# Patient Record
Sex: Male | Born: 1955 | Race: Black or African American | Hispanic: No | Marital: Married | Smoking: Never smoker
Health system: Southern US, Community
[De-identification: ages and names within clinical notes are randomized; demographics above are authoritative.]

## PROBLEM LIST (undated history)

## (undated) DIAGNOSIS — I10 Essential (primary) hypertension: Secondary | ICD-10-CM

---

## 2014-11-06 ENCOUNTER — Ambulatory Visit
Admission: RE | Admit: 2014-11-06 | Discharge: 2014-11-06 | Disposition: A | Discharge status: Home / Self Care | Payer: Federal, State, Local not specified - PPO | Source: Ambulatory Visit | Attending: Family Medicine | Admitting: Family Medicine

## 2014-11-06 ENCOUNTER — Other Ambulatory Visit: Payer: Self-pay | Admitting: Family Medicine

## 2014-11-06 DIAGNOSIS — M7989 Other specified soft tissue disorders: Secondary | ICD-10-CM

## 2014-11-06 DIAGNOSIS — M7122 Synovial cyst of popliteal space [Baker], left knee: Secondary | ICD-10-CM | POA: Insufficient documentation

## 2015-03-20 DIAGNOSIS — M79672 Pain in left foot: Secondary | ICD-10-CM | POA: Insufficient documentation

## 2015-03-20 DIAGNOSIS — I10 Essential (primary) hypertension: Secondary | ICD-10-CM | POA: Insufficient documentation

## 2015-10-19 IMAGING — US US EXTREM LOW VENOUS*L*
1 series · 13 of 24 positions shown · non-contrast
Comparison: None.

CLINICAL DATA: Left foot and ankle pain and edema for several days.
Evaluate for DVT.



[Series 1: us extrem low venous*left* · 0.10mm/px · 13 of 64 slices shown]
[im 1/64]
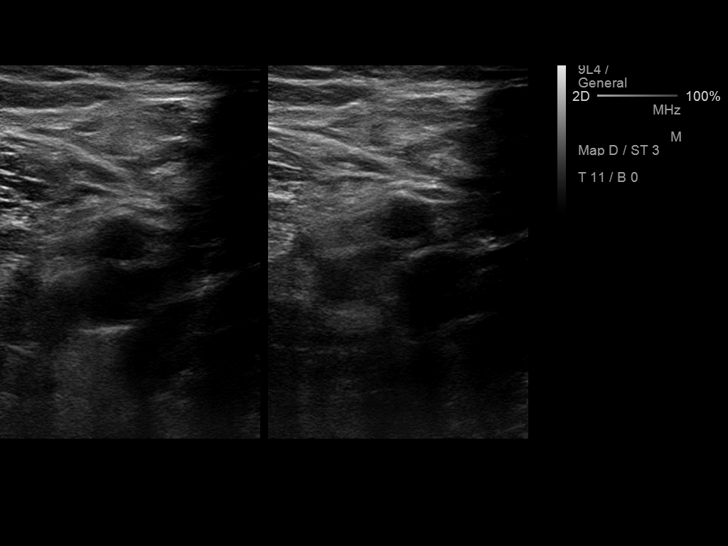
[im 6/64]
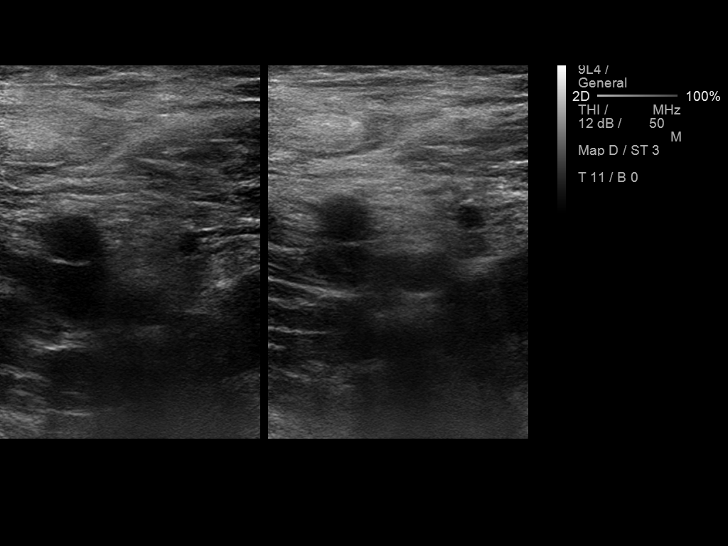
[im 11/64]
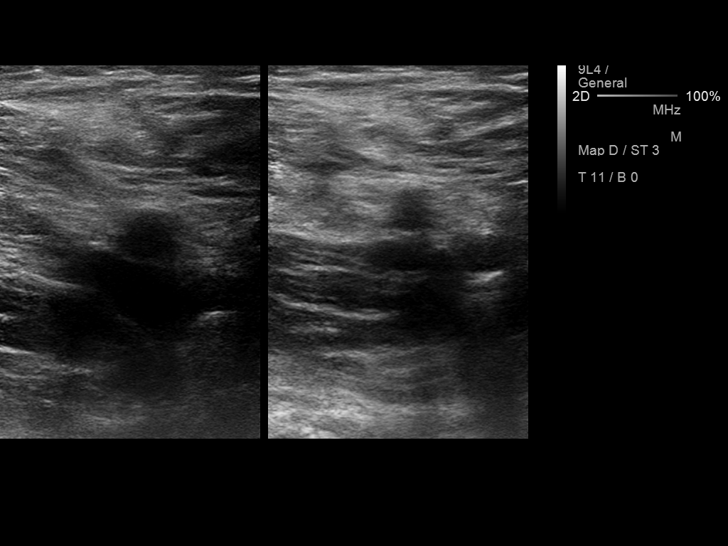
[im 17/64]
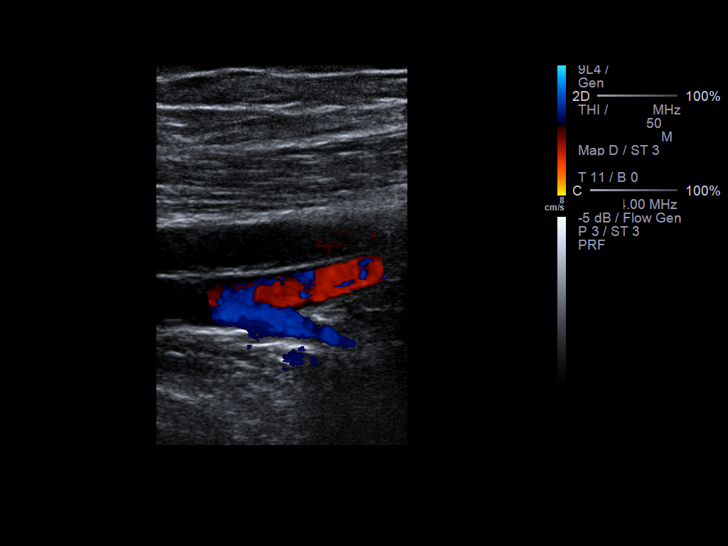
[im 22/64]
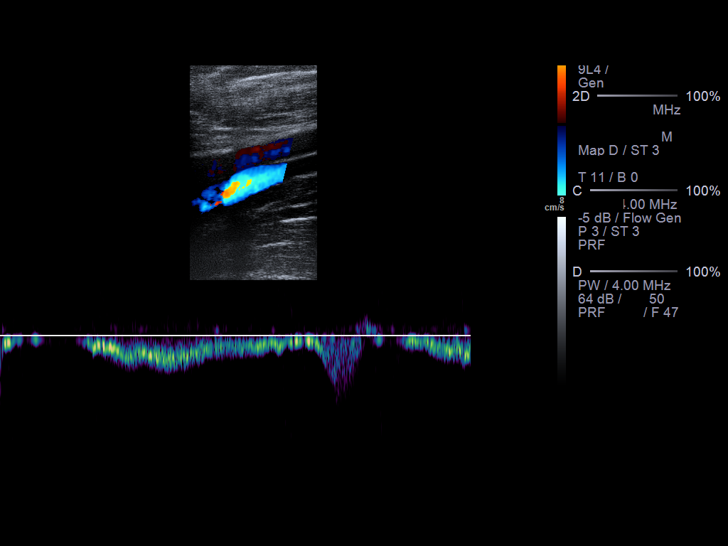
[im 28/64]
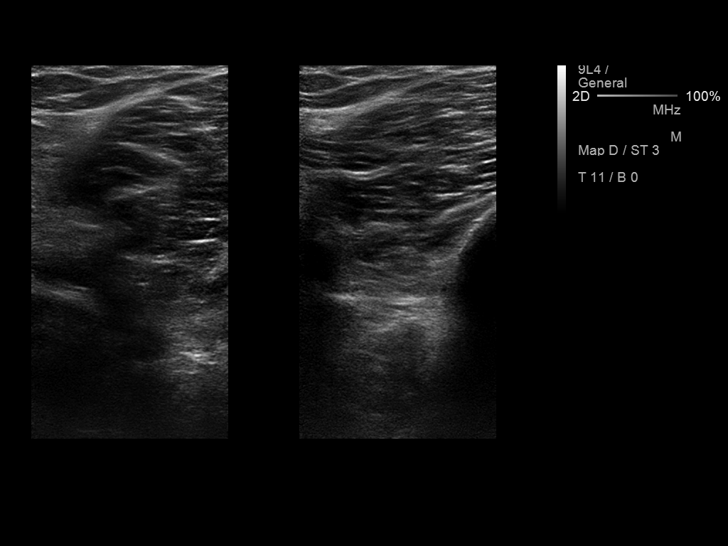
[im 33/64]
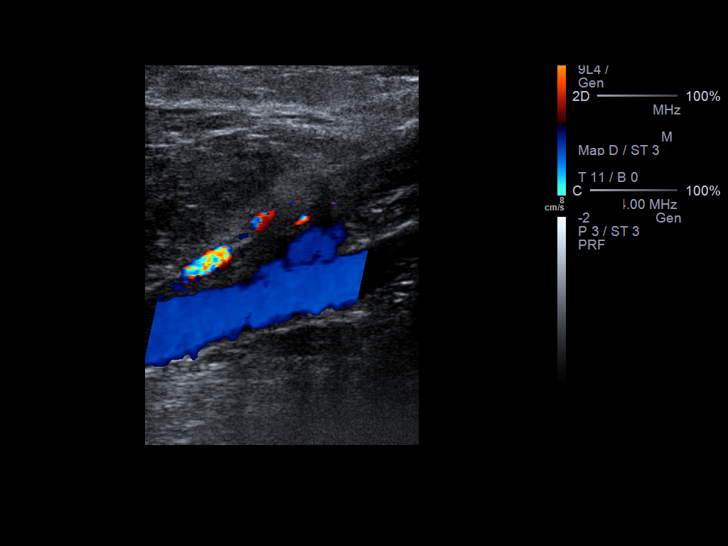
[im 36/64]
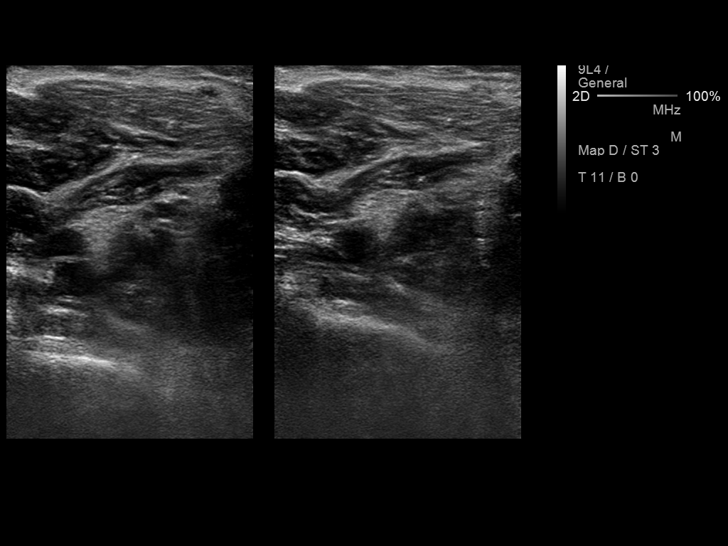
[im 42/64]
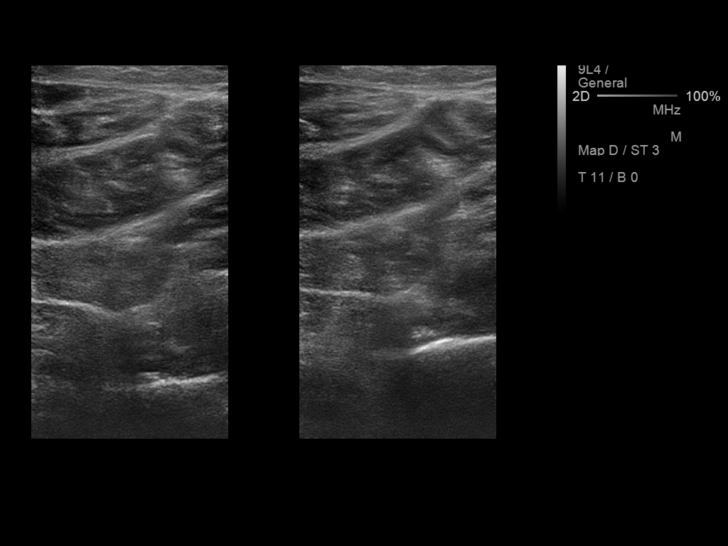
[im 47/64]
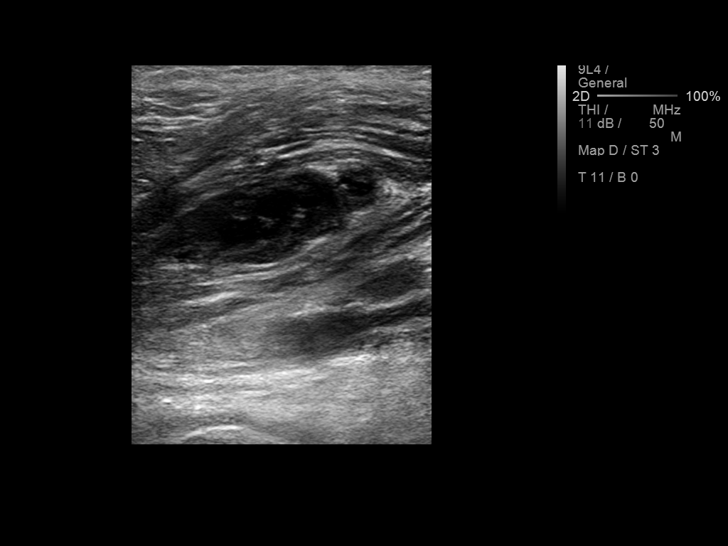
[im 53/64]
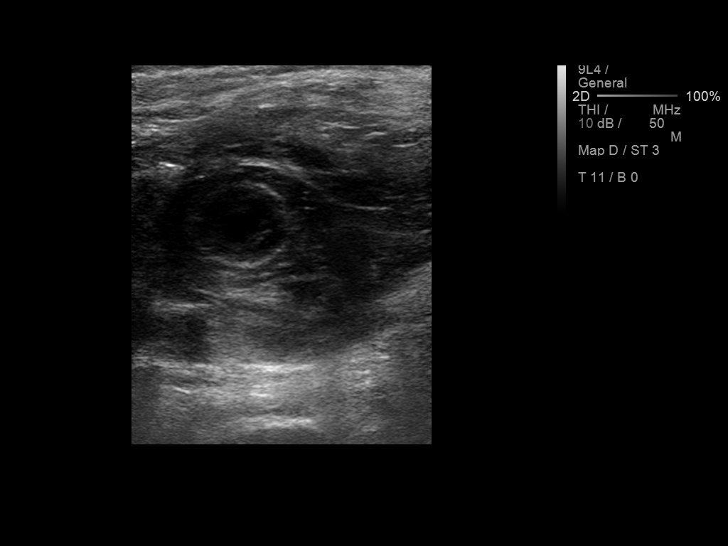
[im 58/64]
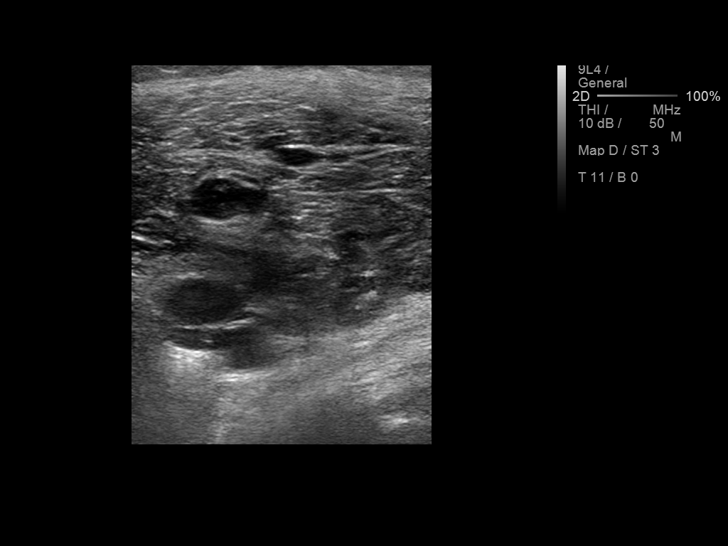
[im 64/64]
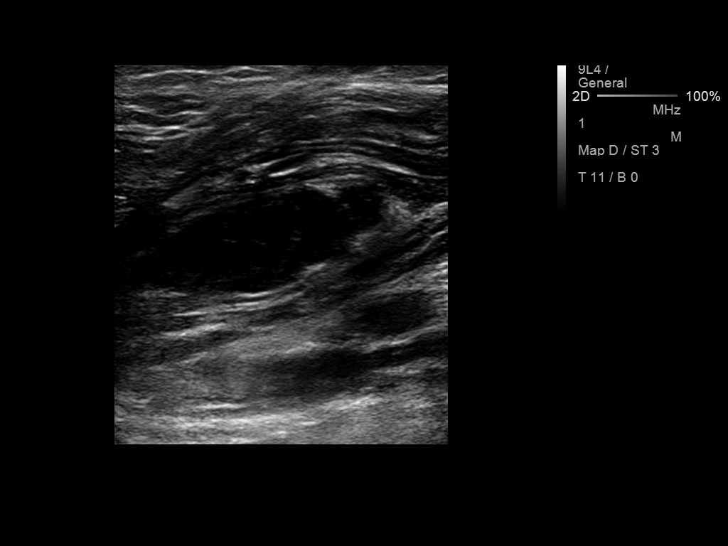

[13 of 24 positions shown; findings below may reference images not displayed]

FINDINGS: Contralateral Common Femoral Vein: Respiratory phasicity is normal
and symmetric with the symptomatic side. No evidence of thrombus.
Normal compressibility.

Common Femoral Vein: No evidence of thrombus. Normal
compressibility, respiratory phasicity and response to augmentation.

Saphenofemoral Junction: No evidence of thrombus. Normal
compressibility and flow on color Doppler imaging.

Profunda Femoral Vein: No evidence of thrombus. Normal
compressibility and flow on color Doppler imaging.

Femoral Vein: No evidence of thrombus. Normal compressibility,
respiratory phasicity and response to augmentation.

Popliteal Vein: No evidence of thrombus. Normal compressibility,
respiratory phasicity and response to augmentation.

Calf Veins: No evidence of thrombus. Normal compressibility and flow
on color Doppler imaging.

Superficial Great Saphenous Vein: No evidence of thrombus. Normal
compressibility and flow on color Doppler imaging.

Venous Reflux:  None.

Other Findings: Note is made of an approximately 3.0 x 1.1 x 1.3 cm
mixed echogenic serpiginous fluid collection within the left
popliteal fossa favored to represent a complex Baker cyst.
IMPRESSION: 1. No evidence of DVT within the left lower extremity.
2. Incidental note made of an approximately 3.0 cm complex
left-sided Baker cyst.

## 2015-11-19 ENCOUNTER — Encounter: Payer: Self-pay | Admitting: *Deleted

## 2015-11-19 ENCOUNTER — Ambulatory Visit
Admission: RE | Admit: 2015-11-19 | Discharge: 2015-11-19 | Disposition: A | Discharge status: Home / Self Care | Payer: Federal, State, Local not specified - PPO | Source: Ambulatory Visit | Attending: Gastroenterology | Admitting: Gastroenterology

## 2015-11-19 ENCOUNTER — Encounter
Admission: RE | Disposition: A | Discharge status: Home / Self Care | Payer: Self-pay | Source: Ambulatory Visit | Attending: Gastroenterology

## 2015-11-19 ENCOUNTER — Ambulatory Visit: Payer: Federal, State, Local not specified - PPO | Admitting: Anesthesiology

## 2015-11-19 DIAGNOSIS — D123 Benign neoplasm of transverse colon: Secondary | ICD-10-CM | POA: Diagnosis not present

## 2015-11-19 DIAGNOSIS — Z79899 Other long term (current) drug therapy: Secondary | ICD-10-CM | POA: Diagnosis not present

## 2015-11-19 DIAGNOSIS — D125 Benign neoplasm of sigmoid colon: Secondary | ICD-10-CM | POA: Insufficient documentation

## 2015-11-19 DIAGNOSIS — Z1211 Encounter for screening for malignant neoplasm of colon: Secondary | ICD-10-CM | POA: Diagnosis present

## 2015-11-19 DIAGNOSIS — Z7982 Long term (current) use of aspirin: Secondary | ICD-10-CM | POA: Insufficient documentation

## 2015-11-19 DIAGNOSIS — D122 Benign neoplasm of ascending colon: Secondary | ICD-10-CM | POA: Insufficient documentation

## 2015-11-19 DIAGNOSIS — D120 Benign neoplasm of cecum: Secondary | ICD-10-CM | POA: Diagnosis not present

## 2015-11-19 DIAGNOSIS — I10 Essential (primary) hypertension: Secondary | ICD-10-CM | POA: Insufficient documentation

## 2015-11-19 HISTORY — PX: COLONOSCOPY WITH PROPOFOL: SHX5780

## 2015-11-19 HISTORY — DX: Essential (primary) hypertension: I10

## 2015-11-19 SURGERY — COLONOSCOPY WITH PROPOFOL
Anesthesia: General

## 2015-11-19 MED ORDER — EPHEDRINE SULFATE 50 MG/ML IJ SOLN
INTRAMUSCULAR | Status: DC | PRN
  Administered 2015-11-19: 10 mg via INTRAVENOUS

## 2015-11-19 MED ORDER — SODIUM CHLORIDE 0.9 % IV SOLN
INTRAVENOUS | Status: DC
  Administered 2015-11-19 (×2): via INTRAVENOUS

## 2015-11-19 MED ORDER — MIDAZOLAM HCL 5 MG/5ML IJ SOLN
INTRAMUSCULAR | Status: DC | PRN
  Administered 2015-11-19: 2 mg via INTRAVENOUS

## 2015-11-19 MED ORDER — PHENYLEPHRINE 40 MCG/ML (10ML) SYRINGE FOR IV PUSH (FOR BLOOD PRESSURE SUPPORT)
PREFILLED_SYRINGE | INTRAVENOUS | Status: DC | PRN
  Administered 2015-11-19 (×3): 100 ug via INTRAVENOUS
  Administered 2015-11-19: 200 ug via INTRAVENOUS

## 2015-11-19 MED ORDER — PROPOFOL 10 MG/ML IV BOLUS
INTRAVENOUS | Status: DC | PRN
  Administered 2015-11-19: 50 mg via INTRAVENOUS

## 2015-11-19 MED ORDER — SODIUM CHLORIDE 0.9 % IV SOLN: INTRAVENOUS | Status: DC

## 2015-11-19 MED ORDER — PROPOFOL 500 MG/50ML IV EMUL
INTRAVENOUS | Status: DC | PRN
  Administered 2015-11-19: 150 ug/kg/min via INTRAVENOUS

## 2015-11-19 MED ORDER — FENTANYL CITRATE (PF) 100 MCG/2ML IJ SOLN
INTRAMUSCULAR | Status: DC | PRN
  Administered 2015-11-19: 50 ug via INTRAVENOUS

## 2015-11-19 NOTE — Anesthesia Preprocedure Evaluation (Signed)
Anesthesia Evaluation  Patient identified by MRN, date of birth, ID band Patient awake    Reviewed: Allergy & Precautions, H&P , NPO status , Patient's Chart, lab work & pertinent test results, reviewed documented beta blocker date and time   Airway Mallampati: II   Neck ROM: full    Dental  (+) Teeth Intact   Pulmonary neg pulmonary ROS,    Pulmonary exam normal        Cardiovascular hypertension, negative cardio ROS Normal cardiovascular exam Rhythm:regular Rate:Normal     Neuro/Psych negative neurological ROS  negative psych ROS   GI/Hepatic negative GI ROS, Neg liver ROS,   Endo/Other  negative endocrine ROS  Renal/GU negative Renal ROS  negative genitourinary   Musculoskeletal   Abdominal   Peds  Hematology negative hematology ROS (+)   Anesthesia Other Findings Past Medical History:   Hypertension                                               History reviewed. No pertinent surgical history. BMI    Body Mass Index   31.17 kg/m 2     Reproductive/Obstetrics                             Anesthesia Physical Anesthesia Plan  ASA: II  Anesthesia Plan: General   Post-op Pain Management:    Induction:   Airway Management Planned:   Additional Equipment:   Intra-op Plan:   Post-operative Plan:   Informed Consent: I have reviewed the patients History and Physical, chart, labs and discussed the procedure including the risks, benefits and alternatives for the proposed anesthesia with the patient or authorized representative who has indicated his/her understanding and acceptance.   Dental Advisory Given  Plan Discussed with: CRNA  Anesthesia Plan Comments:         Anesthesia Quick Evaluation

## 2015-11-19 NOTE — Transfer of Care (Signed)
Immediate Anesthesia Transfer of Care Note  Patient: Mike Robinson  Procedure(s) Performed: Procedure(s): COLONOSCOPY WITH PROPOFOL (N/A)  Patient Location: PACU  Anesthesia Type:General  Level of Consciousness: sedated  Airway & Oxygen Therapy: Patient Spontanous Breathing and Patient connected to nasal cannula oxygen  Post-op Assessment: Report given to RN and Post -op Vital signs reviewed and stable  Post vital signs: Reviewed and stable  Last Vitals:  Filed Vitals:   11/19/15 1032 11/19/15 1237  BP: 176/90 109/71  Pulse: 63 79  Temp: 36.2 C 36 C  Resp: 14 14    Last Pain: There were no vitals filed for this visit.       Complications: No apparent anesthesia complications

## 2015-11-19 NOTE — H&P (Signed)
Outpatient short stay form Pre-procedure 11/19/2015 11:21 AM Lollie Sails MD  Primary Physician: Dr Tracie Harrier  Reason for visit:  Screening colonoscopy  History of present illness:  Patient is a 60 year old male presenting today for screening colonoscopy. This is his first colonoscopy. He tolerated his prep well. There was some nausea with that though. He does not take any blood thinning agents or aspirin products with the exception of 81 mg aspirin that he is held for about a week.    Current facility-administered medications:  .  0.9 %  sodium chloride infusion, , Intravenous, Continuous, Lollie Sails, MD, Last Rate: 20 mL/hr at 11/19/15 1051 .  0.9 %  sodium chloride infusion, , Intravenous, Continuous, Lollie Sails, MD  Prescriptions prior to admission  Medication Sig Dispense Refill Last Dose  . aspirin EC 81 MG tablet Take 81 mg by mouth daily.   11/11/2015  . losartan-hydrochlorothiazide (HYZAAR) 50-12.5 MG tablet Take 1 tablet by mouth daily.   11/19/2015 at 0800     No Known Allergies   Past Medical History  Diagnosis Date  . Hypertension     Review of systems:      Physical Exam    Heart and lungs: Regular rate and rhythm without rub or gallop, lungs are bilaterally clear.    HEENT: Normocephalic atraumatic eyes are anicteric    Other:     Pertinant exam for procedure: Soft nontender nondistended bowel sounds positive normoactive. Mild protuberance.    Planned proceedures: Colonoscopy and indicated procedures. I have discussed the risks benefits and complications of procedures to include not limited to bleeding, infection, perforation and the risk of sedation and the patient wishes to proceed.    Lollie Sails, MD Gastroenterology 11/19/2015  11:21 AM

## 2015-11-19 NOTE — Anesthesia Procedure Notes (Signed)
Date/Time: 11/19/2015 11:30 AM Performed by: Allean Found Pre-anesthesia Checklist: Patient identified, Emergency Drugs available, Suction available, Patient being monitored and Timeout performed Patient Re-evaluated:Patient Re-evaluated prior to inductionOxygen Delivery Method: Nasal cannula Intubation Type: IV induction Placement Confirmation: positive ETCO2 and breath sounds checked- equal and bilateral

## 2015-11-19 NOTE — Op Note (Signed)
[facility] Gastroenterology Patient Name: Mike Robinson Procedure Date: 11/19/2015 11:27 AM MRN: [id number] Account #: [id number] Date of Birth: [birth year] Admit Type: Outpatient Age: 60 Room: [facility] ENDO ROOM 3 Gender: Male Note Status: Finalized Procedure:            Colonoscopy Indications:          Screening for colorectal malignant neoplasm, This is                        the patient's first colonoscopy Providers:            Lollie Sails, MD Referring MD:         Tracie Harrier, MD (Referring MD) Medicines:            Monitored Anesthesia Care Complications:        No immediate complications. Procedure:            Pre-Anesthesia Assessment:                       - ASA Grade Assessment: II - A patient with mild                        systemic disease.                       After obtaining informed consent, the colonoscope was                        passed under direct vision. Throughout the procedure,                        the patient's blood pressure, pulse, and oxygen                        saturations were monitored continuously. The                        Colonoscope was introduced through the anus and                        advanced to the the cecum, identified by appendiceal                        orifice and ileocecal valve. The colonoscopy was                        unusually difficult due to significant looping and a                        tortuous colon. Successful completion of the procedure                        was aided by changing the patient to a supine position,                        changing the patient to a prone position and using                        manual pressure. The quality of the bowel preparation  was good. Findings:      A 3 mm polyp was found in the distal sigmoid colon. The polyp was       sessile. The polyp was removed with a cold biopsy forceps. Resection and       retrieval were complete.      A 2 mm polyp was found in the hepatic flexure. The polyp was sessile.       The polyp was removed with a cold biopsy forceps. Resection and       retrieval were complete.      Two sessile polyps were found in the cecum. The polyps were 2 to 3 mm in       size. These polyps were removed with a cold biopsy forceps. Resection       and retrieval were complete.      A localized area of mildly granular mucosa was found at the ileocecal       valve. Biopsies were taken with a cold forceps for histology.      A 11 mm polyp was found in the proximal ascending colon. The polyp was       pedunculated. The polyp was removed with a cold biopsy forceps. The       polyp was removed with a cold snare. Resection and retrieval were       complete. To prevent bleeding after the polypectomy, one hemostatic clip       was successfully placed. There was no bleeding at the end of the       maneuver.      The digital rectal exam was normal. Impression:           - One 3 mm polyp in the distal sigmoid colon, removed                        with a cold biopsy forceps. Resected and retrieved.                       - One 2 mm polyp at the hepatic flexure, removed with a                        cold biopsy forceps. Resected and retrieved.                       - Two 2 to 3 mm polyps in the cecum, removed with a                        cold biopsy forceps. Resected and retrieved.                       - Granular mucosa at the ileocecal valve. Biopsied.                       - One 11 mm polyp in the proximal ascending colon,                        removed with a cold snare and removed with a cold                        biopsy forceps. Resected and retrieved. Clip was placed. Recommendation:       - Discharge patient to home. Procedure Code(s):    ---  Professional ---                       [id number], Colonoscopy, flexible; with removal of tumor(s),                        polyp(s), or other lesion(s) by snare technique                        45380, 59, Colonoscopy, flexible; with biopsy, single                        or multiple Diagnosis Code(s):    --- Professional ---                       Z12.11, Encounter for screening for malignant neoplasm                        of colon                       D12.5, Benign neoplasm of sigmoid colon                       D12.3, Benign neoplasm of transverse colon (hepatic                        flexure or splenic flexure)                       D12.2, Benign neoplasm of ascending colon                       D12.0, Benign neoplasm of cecum                       K63.89, Other specified diseases of intestine CPT copyright 2016 American Medical Association. All rights reserved. The codes documented in this report are preliminary and upon coder review may  be revised to meet current compliance requirements. Lollie Sails, MD 11/19/2015 12:32:06 PM This report has been signed electronically. Number of Addenda: 0 Note Initiated On: 11/19/2015 11:27 AM Scope Withdrawal Time: 0 hours 24 minutes 31 seconds  Total Procedure Duration: 0 hours 46 minutes 27 seconds       [facility]

## 2015-11-20 ENCOUNTER — Encounter: Payer: Self-pay | Admitting: Gastroenterology

## 2015-11-20 LAB — SURGICAL PATHOLOGY

## 2015-11-22 NOTE — Anesthesia Postprocedure Evaluation (Signed)
Anesthesia Post Note  Patient: Mike Robinson  Procedure(s) Performed: Procedure(s) (LRB): COLONOSCOPY WITH PROPOFOL (N/A)  Patient location during evaluation: PACU Anesthesia Type: General Level of consciousness: awake and alert Pain management: pain level controlled Vital Signs Assessment: post-procedure vital signs reviewed and stable Respiratory status: spontaneous breathing, nonlabored ventilation, respiratory function stable and patient connected to nasal cannula oxygen Cardiovascular status: blood pressure returned to baseline and stable Postop Assessment: no signs of nausea or vomiting Anesthetic complications: no    Last Vitals:  Filed Vitals:   11/19/15 1257 11/19/15 1308  BP: 113/92 134/79  Pulse: 69 62  Temp:    Resp: 15 19    Last Pain:  Filed Vitals:   11/20/15 0750  PainSc: 0-No pain                 Molli Barrows

## 2016-03-19 ENCOUNTER — Encounter (INDEPENDENT_AMBULATORY_CARE_PROVIDER_SITE_OTHER): Payer: Self-pay

## 2016-04-21 ENCOUNTER — Ambulatory Visit (INDEPENDENT_AMBULATORY_CARE_PROVIDER_SITE_OTHER): Payer: Federal, State, Local not specified - PPO | Admitting: Vascular Surgery

## 2016-04-21 ENCOUNTER — Encounter (INDEPENDENT_AMBULATORY_CARE_PROVIDER_SITE_OTHER): Payer: Self-pay | Admitting: Vascular Surgery

## 2016-04-21 VITALS — BP 158/88 | HR 73 | Resp 17 | Ht 68.0 in | Wt 194.0 lb

## 2016-04-21 DIAGNOSIS — I890 Lymphedema, not elsewhere classified: Secondary | ICD-10-CM

## 2016-04-21 DIAGNOSIS — M7989 Other specified soft tissue disorders: Secondary | ICD-10-CM | POA: Diagnosis not present

## 2016-04-21 DIAGNOSIS — M79606 Pain in leg, unspecified: Secondary | ICD-10-CM | POA: Diagnosis not present

## 2016-04-21 DIAGNOSIS — I872 Venous insufficiency (chronic) (peripheral): Secondary | ICD-10-CM | POA: Diagnosis not present

## 2016-04-21 NOTE — Progress Notes (Signed)
[facility] Admission History & Physical  MRN : [id number]  Mike Robinson is a 60 y.o. (04/10/1956) male who presents with chief complaint of  Chief Complaint  Patient presents with  . Re-evaluation    Follow up 6 month  .  History of Present Illness: The patient returns to the office for followup evaluation regarding leg swelling.  The swelling has improved quite a bit and the pain associated with swelling has decreased substantially. There have not been any interval development of a ulcerations or wounds.  Since the previous visit the patient has been wearing graduated compression stockings and has noted little significant improvement in the lymphedema. The patient has been using compression routinely morning until night.  The patient also states elevation during the day and exercise is being done too.  Current Meds  Medication Sig  . aspirin EC 81 MG tablet Take 81 mg by mouth daily.  Marland Kitchen losartan-hydrochlorothiazide (HYZAAR) 50-12.5 MG tablet Take 1 tablet by mouth daily.    Past Medical History:  Diagnosis Date  . Hypertension     Past Surgical History:  Procedure Laterality Date  . COLONOSCOPY WITH PROPOFOL N/A 11/19/2015   Procedure: COLONOSCOPY WITH PROPOFOL;  Surgeon: Lollie Sails, MD;  Location: [facility] ENDOSCOPY;  Service: Endoscopy;  Laterality: N/A;    Social History Social History  Substance Use Topics  . Smoking status: Never Smoker  . Smokeless tobacco: Never Used  . Alcohol use No    Family History Family History  Problem Relation Age of Onset  . Arthritis Father   No family history of bleeding/clotting disorders, porphyria or autoimmune disease   No Known Allergies   REVIEW OF SYSTEMS (Negative unless checked)  Constitutional: [] Weight loss  [] Fever  [] Chills Cardiac: [] Chest pain   [] Chest pressure   [] Palpitations   [] Shortness of breath when laying flat   [] Shortness of breath with exertion. Vascular:  [] Pain in legs  with walking   [] Pain in legs at rest  [] History of DVT   [] Phlebitis   [x] Swelling in legs   [x] Varicose veins   [] Non-healing ulcers Pulmonary:   [] Uses home oxygen   [] Productive cough   [] Hemoptysis   [] Wheeze  [] COPD   [] Asthma Neurologic:  [] Dizziness   [] Seizures   [] History of stroke   [] History of TIA  [] Aphasia   [] Vissual changes   [] Weakness or numbness in arm   [] Weakness or numbness in leg Musculoskeletal:   [] Joint swelling   [] Joint pain   [] Low back pain Hematologic:  [] Easy bruising  [] Easy bleeding   [] Hypercoagulable state   [] Anemic Gastrointestinal:  [] Diarrhea   [] Vomiting  [] Gastroesophageal reflux/heartburn   [] Difficulty swallowing. Genitourinary:  [] Chronic kidney disease   [] Difficult urination  [] Frequent urination   [] Blood in urine Skin:  [] Rashes   [] Ulcers  Psychological:  [] History of anxiety   []  History of major depression.  Physical Examination  Vitals:   04/21/16 0956  BP: (!) 158/88  Pulse: 73  Resp: 17  Weight: 194 lb (88 kg)  Height: 5\' 8"  (1.727 m)   Body mass index is 29.5 kg/m. Gen: WD/WN, NAD Head: [place]/AT, No temporalis wasting.  Ear/Nose/Throat: Hearing grossly intact, nares w/o erythema or drainage, poor dentition Eyes: PER, EOMI, sclera nonicteric.  Neck: Supple, no masses.  No bruit or JVD.  Pulmonary:  Good air movement, clear to auscultation bilaterally, no use of accessory muscles.  Cardiac: RRR, normal S1, S2, no Murmurs. Vascular:   2+ edema bilateral lower  extremities, mild venous stasis changes noted Vessel Right Left  Radial Palpable Palpable  Ulnar Palpable Palpable  Brachial Palpable Palpable  Carotid Palpable Palpable  Femoral Palpable Palpable  Popliteal Palpable Palpable  PT Palpable Palpable  DP Palpable Palpable   Gastrointestinal: soft, non-distended. No guarding/no peritoneal signs.  Musculoskeletal: M/S 5/5 throughout.  No deformity or atrophy.  Neurologic: CN 2-12 intact. Pain and light touch intact in  extremities.  Symmetrical.  Speech is fluent. Motor exam as listed above. Psychiatric: Judgment intact, Mood & affect appropriate for pt's clinical situation. Dermatologic: No rashes or ulcers noted.  No changes consistent with cellulitis. Lymph : No Cervical lymphadenopathy, no lichenification or skin changes of chronic lymphedema.  CBC No results found for: WBC, HGB, HCT, MCV, PLT  BMET No results found for: NA, K, CL, CO2, GLUCOSE, BUN, CREATININE, CALCIUM, GFRNONAA, GFRAA CrCl cannot be calculated (No order found.).  COAG No results found for: INR, PROTIME  Radiology No results found.  Assessment/Plan 1. Lymphedema: The patient returns to the office for followup evaluation regarding leg swelling.  The swelling has improved quite a bit and the pain associated with swelling has decreased substantially. There have not been any interval development of a ulcerations or wounds.  He notes the lymph pump has been a big help.  Since the previous visit the patient has been wearing graduated compression stockings and has noted little significant improvement in the lymphedema. The patient has been using compression routinely morning until night.  The patient also states elevation during the day and exercise is being done too.     Hortencia Pilar, MD  04/21/2016 10:53 AM

## 2016-05-04 DIAGNOSIS — M7989 Other specified soft tissue disorders: Secondary | ICD-10-CM

## 2016-05-04 DIAGNOSIS — M79606 Pain in leg, unspecified: Secondary | ICD-10-CM | POA: Insufficient documentation

## 2016-05-04 DIAGNOSIS — I890 Lymphedema, not elsewhere classified: Secondary | ICD-10-CM | POA: Insufficient documentation

## 2016-05-04 DIAGNOSIS — I872 Venous insufficiency (chronic) (peripheral): Secondary | ICD-10-CM | POA: Insufficient documentation

## 2016-08-13 ENCOUNTER — Encounter: Payer: Self-pay | Admitting: Podiatry

## 2016-08-13 ENCOUNTER — Encounter: Payer: Self-pay | Admitting: *Deleted

## 2016-08-13 ENCOUNTER — Ambulatory Visit (INDEPENDENT_AMBULATORY_CARE_PROVIDER_SITE_OTHER): Payer: Federal, State, Local not specified - PPO | Admitting: Podiatry

## 2016-08-13 VITALS — BP 174/104 | HR 65 | Resp 16

## 2016-08-13 DIAGNOSIS — L603 Nail dystrophy: Secondary | ICD-10-CM

## 2016-08-13 NOTE — Progress Notes (Signed)
   Subjective:    Patient ID: Mike Robinson, male    DOB: Aug 09, 1955, 61 y.o.   MRN: [id number]  HPI: He presents today with a chief concern of thick discolored toenails times the past several years. He states that his wife comes here was diagnosed with fungus in there going on a cruise in 9 weeks.    Review of Systems  All other systems reviewed and are negative.      Objective:   Physical Exam: Vital signs are stable alert and oriented 3. Pulses are palpable. Neurologic sensorium is intact. Deep tendon reflexes are intact. Muscle strength is normal bilateral. Orthopedic evaluation was resulted cyst of the ankle full range of motion crepitus mild hammertoe deformities noted bilateral. Today's evaluation x-rays of the well-hydrated cutis mild tinea pedis and plantar aspects of bilateral foot with thickening of the toenails discoloration malodorous subungual debris hallux and lesser nails bilateral.        Assessment & Plan:  Pain in limb secondary to onychomycosis nail dystrophy.  Plan: Symptoms of skin and nail were taken today to be sent for pathologic evaluation we will notify him of the results.

## 2016-09-10 ENCOUNTER — Ambulatory Visit (INDEPENDENT_AMBULATORY_CARE_PROVIDER_SITE_OTHER): Payer: Federal, State, Local not specified - PPO | Admitting: Podiatry

## 2016-09-10 ENCOUNTER — Encounter: Payer: Self-pay | Admitting: Podiatry

## 2016-09-10 DIAGNOSIS — Z79899 Other long term (current) drug therapy: Secondary | ICD-10-CM

## 2016-09-10 DIAGNOSIS — L603 Nail dystrophy: Secondary | ICD-10-CM

## 2016-09-10 MED ORDER — TERBINAFINE HCL 250 MG PO TABS: 250.0000 mg | ORAL_TABLET | Freq: Every day | ORAL | 0 refills | Status: DC

## 2016-09-10 NOTE — Patient Instructions (Signed)

## 2016-09-10 NOTE — Progress Notes (Signed)
He presents today for follow-up of his pathology report.  Objective: Vital signs are stable he's alert and oriented 3 pathology report does demonstrate onychomycosis with a very typical fungus.  Assessment: Onychomycosis.  Plan: Start him on oral therapy today after discussing topical therapy and laser therapy. He would like to continue with oral therapy for 120 days Lamisil 250 mg tablets 1 by mouth daily for the next 30 days. Blood work was requested today and I will follow-up with him in 1 month or little more for his next set of blood work and thus he hasn't performed by his primary provider. I will follow-up with him at that time and increase his medication to 90 days.

## 2016-09-11 LAB — HEPATIC FUNCTION PANEL
ALBUMIN: 4.3 g/dL (ref 3.5–5.5)
ALK PHOS: 112 IU/L (ref 39–117)
ALT: 29 IU/L (ref 0–44)
AST: 21 IU/L (ref 0–40)
BILIRUBIN TOTAL: 0.3 mg/dL (ref 0.0–1.2)
BILIRUBIN, DIRECT: 0.09 mg/dL (ref 0.00–0.40)
Total Protein: 7 g/dL (ref 6.0–8.5)

## 2016-09-12 ENCOUNTER — Telehealth: Payer: Self-pay | Admitting: *Deleted

## 2016-09-12 NOTE — Telephone Encounter (Addendum)
-----   Message from Garrel Ridgel, [place] sent at 09/11/2016  7:18 AM EDT ----- Blood work looks perfect and may continue medication.09/12/2016-I informed pt of Dr. Stephenie Acres orders.

## 2016-10-29 ENCOUNTER — Ambulatory Visit (INDEPENDENT_AMBULATORY_CARE_PROVIDER_SITE_OTHER): Payer: Federal, State, Local not specified - PPO | Admitting: Podiatry

## 2016-10-29 ENCOUNTER — Encounter: Payer: Self-pay | Admitting: Podiatry

## 2016-10-29 DIAGNOSIS — L603 Nail dystrophy: Secondary | ICD-10-CM

## 2016-10-29 DIAGNOSIS — Z79899 Other long term (current) drug therapy: Secondary | ICD-10-CM | POA: Diagnosis not present

## 2016-10-29 MED ORDER — TERBINAFINE HCL 250 MG PO TABS: 250.0000 mg | ORAL_TABLET | Freq: Every day | ORAL | 0 refills | Status: DC

## 2016-10-29 NOTE — Progress Notes (Signed)
He presents today for follow-up of his Lamisil therapy. He states hischanges as of yet. He has already started on his second bottle of medicine. Just recently had blood work done for his primary doctor.  Objective: Vital signs are stable alert and oriented 3. Patient has shown me his blood work today consisting of AST and a LT and they're perfectly within normal limits.  Assessment: Onychomycosis long-term therapy with Lamisil.  Plan: Wrote another 90 day prescription follow-up with him in 4 months

## 2017-03-04 ENCOUNTER — Ambulatory Visit: Payer: Federal, State, Local not specified - PPO | Admitting: Podiatry

## 2017-03-09 ENCOUNTER — Ambulatory Visit (INDEPENDENT_AMBULATORY_CARE_PROVIDER_SITE_OTHER): Payer: Federal, State, Local not specified - PPO | Admitting: Podiatry

## 2017-03-09 ENCOUNTER — Encounter: Payer: Self-pay | Admitting: Podiatry

## 2017-03-09 DIAGNOSIS — L603 Nail dystrophy: Secondary | ICD-10-CM

## 2017-03-09 MED ORDER — TERBINAFINE HCL 250 MG PO TABS: 250.0000 mg | ORAL_TABLET | Freq: Every day | ORAL | 0 refills | Status: AC

## 2017-03-10 NOTE — Progress Notes (Signed)
He presents today for follow-up of his nail fungus. He has completed his 120 days of Lamisil and states that I guess they are looking okay.  Objective: He states these had no problems with the medication. He's rates that his nails are looking much better right foot seems to be the slowest to heal. Vital signs are stable he is alert and oriented 3. Pulses are palpable. Nails have cleared up approximately 50% on the right foot left foot is 100% clear there is no mycosis to the foot.  Assessment: Well-healing onychomycosis without complications with oral medication.  Plan: I instructed him to take another 30 tablets 1 tablet every other day and I'll follow-up with him in 3 months.

## 2017-06-08 ENCOUNTER — Ambulatory Visit: Payer: Federal, State, Local not specified - PPO | Admitting: Podiatry

## 2017-06-15 ENCOUNTER — Encounter: Payer: Self-pay | Admitting: Podiatry

## 2017-06-15 ENCOUNTER — Ambulatory Visit: Payer: Federal, State, Local not specified - PPO | Admitting: Podiatry

## 2017-06-15 DIAGNOSIS — L603 Nail dystrophy: Secondary | ICD-10-CM | POA: Diagnosis not present

## 2017-06-15 NOTE — Progress Notes (Signed)
He presents today for follow-up of his Lamisil therapy has completed 120 days and was supposed to take 1 tablet every other day for the past 3 months however he took all of the medication daily as directed.  He states that his toenails look great and he is ready to stop the medication.  Objective: Vital signs are stable he is alert and oriented x3.  Toenails appear to be growing out nearly 100% I see no signs of onychomycosis or tinea pedis.  Assessment: Well-healing onychomycosis tinea pedis.  Plan: Follow-up with me on an as-needed basis.

## 2023-02-04 ENCOUNTER — Ambulatory Visit: Payer: Medicare Other | Admitting: Urology

## 2023-02-04 ENCOUNTER — Encounter: Payer: Self-pay | Admitting: Urology

## 2023-02-04 VITALS — BP 172/92 | HR 80 | Ht 68.0 in | Wt 193.0 lb

## 2023-02-04 DIAGNOSIS — R972 Elevated prostate specific antigen [PSA]: Secondary | ICD-10-CM | POA: Diagnosis not present

## 2023-02-04 NOTE — Progress Notes (Signed)
I, Mike Robinson, acting as a scribe for Mike Altes, MD., have documented all relevant documentation on the behalf of Mike Altes, MD, as directed by Mike Altes, MD while in the presence of Mike Altes, MD.  02/04/2023 4:02 PM   Mike Robinson [birth year] [id number]  Referring provider: Barbette Reichmann, MD [address] [facility] [place],  [place] [address]  Chief Complaint  Patient presents with   Elevated PSA    HPI: Mike Robinson is a 67 y.o. male referred for evaluation of an elevated PSA.   PSA drawn 12/31/22 slightly elevated at 5.46. Since 2022 he has had a fluctuating PSA which has risen at times to the low to mid 4 range and then decreased to the low to upper 3 range. No bothersome lower urinary tract symptoms.  Denies dysuria, gross hematuria.  No family history of prostate cancer.   PMH: Past Medical History:  Diagnosis Date   Hypertension     Surgical History: Past Surgical History:  Procedure Laterality Date   COLONOSCOPY WITH PROPOFOL N/A 11/19/2015   Procedure: COLONOSCOPY WITH PROPOFOL;  Surgeon: Christena Deem, MD;  Location: [facility] ENDOSCOPY;  Service: Endoscopy;  Laterality: N/A;    Home Medications:  Allergies as of 02/04/2023   No Known Allergies      Medication List        Accurate as of February 04, 2023  4:02 PM. If you have any questions, ask your nurse or doctor.          amLODipine 2.5 MG tablet Commonly known as: NORVASC Take by mouth.   aspirin EC 81 MG tablet Take 81 mg by mouth daily.   losartan-hydrochlorothiazide 50-12.5 MG tablet Commonly known as: HYZAAR Take 1 tablet by mouth daily.   terbinafine 250 MG tablet Commonly known as: LamISIL Take 1 tablet (250 mg total) by mouth daily.        Allergies: No Known Allergies  Family History: Family History  Problem Relation Age of Onset   Arthritis Father     Social History:  reports that he has never smoked. He has never  used smokeless tobacco. He reports that he does not drink alcohol and does not use drugs.   Physical Exam: BP (!) 172/92   Pulse 80   Ht 5\' 8"  (1.727 m)   Wt 193 lb (87.5 kg)   BMI 29.35 kg/m   Constitutional:  Alert and oriented, No acute distress. HEENT: [place] AT Respiratory: Normal respiratory effort, no increased work of breathing. GU: Prostate 60 gram, the left side is more prominent than the right side, however the consistency is normal and uniform throughout Psychiatric: Normal mood and affect.   Assessment & Plan:    1. Elevated PSA Although PSA is a prostate cancer screening test he was informed that cancer is not the most common cause of an elevated PSA. Other potential causes including BPH and inflammation were discussed. He was informed that the only way to adequately diagnose prostate cancer would be a transrectal ultrasound and biopsy of the prostate. The procedure was discussed including potential risks of bleeding and infection/sepsis. He was also informed that a negative biopsy does not conclusively rule out the possibility that prostate cancer may be present and that continued monitoring is required. The use of newer adjunctive blood tests including PHI and 4kScore were discussed. The use of multiparametric prostate MRI to evaluate for lesions suspicious for high grade prostate and aid in targeted  biopsy was reviewed. Continued periodic surveillance was also discussed.  After discussing management options, he has elected prostate MRI.  Order placed and we'll call with results.  I have reviewed the above documentation for accuracy and completeness, and I agree with the above.   Mike Altes, MD  [facility] [address], [address] [place], [place] [address] [phone]

## 2023-02-04 NOTE — Patient Instructions (Signed)
Prostate MRI Prep:  1- No ejaculation 48 hours prior to exam  2- No caffeine or carbonated beverages on day of the exam  3- Eat light diet evening prior and day of exam  4- Avoid eating 4 hours prior to exam  5- Fleets enema needs to be done 4 hours prior to exam -See below. Can be purchased at the drug store.

## 2023-02-05 ENCOUNTER — Encounter: Payer: Self-pay | Admitting: Urology

## 2023-02-20 ENCOUNTER — Ambulatory Visit
Admission: RE | Admit: 2023-02-20 | Discharge: 2023-02-20 | Disposition: A | Discharge status: Home / Self Care | Payer: Medicare Other | Source: Ambulatory Visit | Attending: Urology | Admitting: Urology

## 2023-02-20 DIAGNOSIS — R972 Elevated prostate specific antigen [PSA]: Secondary | ICD-10-CM | POA: Diagnosis present

## 2023-02-20 MED ORDER — GADOBUTROL 1 MMOL/ML IV SOLN
8.0000 mL | Freq: Once | INTRAVENOUS | Status: AC | PRN
  Administered 2023-02-20: 8 mL via INTRAVENOUS

## 2023-06-18 ENCOUNTER — Other Ambulatory Visit: Payer: Self-pay | Admitting: Medical Genetics

## 2023-06-19 ENCOUNTER — Other Ambulatory Visit: Payer: Self-pay

## 2023-06-19 ENCOUNTER — Other Ambulatory Visit
Admission: RE | Admit: 2023-06-19 | Discharge: 2023-06-19 | Disposition: A | Discharge status: Home / Self Care | Payer: Medicare Other | Source: Ambulatory Visit | Attending: Medical Genetics | Admitting: Medical Genetics

## 2023-07-03 LAB — GENECONNECT MOLECULAR SCREEN: Genetic Analysis Overall Interpretation: NEGATIVE

## 2023-08-12 ENCOUNTER — Encounter: Payer: Self-pay | Admitting: Urology

## 2023-08-12 ENCOUNTER — Ambulatory Visit: Payer: Medicare Other | Admitting: Urology

## 2023-08-12 VITALS — BP 181/99 | HR 102 | Ht 68.0 in | Wt 194.0 lb

## 2023-08-12 DIAGNOSIS — R972 Elevated prostate specific antigen [PSA]: Secondary | ICD-10-CM

## 2023-08-12 NOTE — Progress Notes (Signed)
    I, Mike Robinson, acting as a scribe for Mike Altes, MD., have documented all relevant documentation on the behalf of Mike Altes, MD, as directed by Mike Altes, MD while in the presence of Mike Altes, MD.  08/12/2023 10:54 AM   Mike Robinson Feb 01, 1956 [id number]  Referring provider: Barbette Reichmann, MD [address] [facility] [place],  [place] [address]  Chief Complaint  Patient presents with   Elevated PSA   Urologic history:  1. Elevated PSA Treatment PSA July 2024 elevated 5.46 Baseline PSA since 2022 fluctuating between the previous years low 3-mid 4 range.  Prostate MRI 02/20/23 with a 49 cc gland and no abnormalities suspicious for intermediate or indeterminate or high-grade prostate cancer.  PSA density 0.11  HPI: Mike Robinson is a 68 y.o. male presents for a 6 month follow-up  No problem since last visit. A PSA repeated 07/07/2023 significantly below baseline at 0.41 No bothersome LUTS No dysuria or gross hematuria.    PMH: Past Medical History:  Diagnosis Date   Hypertension     Surgical History: Past Surgical History:  Procedure Laterality Date   COLONOSCOPY WITH PROPOFOL N/A 11/19/2015   Procedure: COLONOSCOPY WITH PROPOFOL;  Surgeon: Mike Deem, MD;  Location: [facility] ENDOSCOPY;  Service: Endoscopy;  Laterality: N/A;    Home Medications:  Allergies as of 08/12/2023   No Known Allergies      Medication List        Accurate as of August 12, 2023 10:54 AM. If you have any questions, ask your nurse or doctor.          STOP taking these medications    losartan-hydrochlorothiazide 50-12.5 MG tablet Commonly known as: HYZAAR Stopped by: Mike Robinson       TAKE these medications    amLODipine 2.5 MG tablet Commonly known as: NORVASC Take by mouth.   aspirin EC 81 MG tablet Take 81 mg by mouth as needed.   olmesartan 40 MG tablet Commonly known as: BENICAR Take 1 tablet by mouth  daily.   terbinafine 250 MG tablet Commonly known as: LamISIL Take 1 tablet (250 mg total) by mouth daily.        Allergies: No Known Allergies  Family History: Family History  Problem Relation Age of Onset   Arthritis Father     Social History:  reports that he has never smoked. He has never used smokeless tobacco. He reports that he does not drink alcohol and does not use drugs.   Physical Exam: BP (!) 181/99   Pulse (!) 102   Ht 5\' 8"  (1.727 m)   Wt 194 lb (88 kg)   BMI 29.50 kg/m   Constitutional:  Alert and oriented, No acute distress. HEENT: Mike Robinson AT Respiratory: Normal respiratory effort, no increased work of breathing. Psychiatric: Normal mood and affect.   Assessment & Plan:    1. Elevated PSA Negative prostate MRI Most recent PSA was significantly below baseline.  He sees his PCP every 6 months and will have his PSA rechecked in 6 months.  1 year follow-up.  I have reviewed the above documentation for accuracy and completeness, and I agree with the above.   Mike Altes, MD  [facility] [address] [place], [place] [address] [phone]

## 2023-08-20 ENCOUNTER — Other Ambulatory Visit: Payer: Federal, State, Local not specified - PPO

## 2024-01-08 ENCOUNTER — Ambulatory Visit

## 2024-01-08 DIAGNOSIS — Z860101 Personal history of adenomatous and serrated colon polyps: Secondary | ICD-10-CM | POA: Diagnosis not present

## 2024-01-08 DIAGNOSIS — D120 Benign neoplasm of cecum: Secondary | ICD-10-CM | POA: Diagnosis not present

## 2024-01-08 DIAGNOSIS — K641 Second degree hemorrhoids: Secondary | ICD-10-CM | POA: Diagnosis not present

## 2024-01-08 DIAGNOSIS — K5140 Inflammatory polyps of colon without complications: Secondary | ICD-10-CM | POA: Diagnosis not present

## 2024-01-08 DIAGNOSIS — Z1211 Encounter for screening for malignant neoplasm of colon: Secondary | ICD-10-CM | POA: Diagnosis present

## 2024-08-10 ENCOUNTER — Ambulatory Visit: Payer: Federal, State, Local not specified - PPO | Admitting: Urology
